# Patient Record
Sex: Female | Born: 1970 | Hispanic: Yes | Marital: Married | Smoking: Never smoker
Health system: Southern US, Community
[De-identification: ages and names within clinical notes are randomized; demographics above are authoritative.]

---

## 2016-10-15 ENCOUNTER — Other Ambulatory Visit: Payer: Self-pay | Admitting: Family Medicine

## 2016-10-15 DIAGNOSIS — N938 Other specified abnormal uterine and vaginal bleeding: Secondary | ICD-10-CM

## 2016-10-19 ENCOUNTER — Ambulatory Visit
Admission: RE | Admit: 2016-10-19 | Discharge: 2016-10-19 | Disposition: A | Discharge status: Home / Self Care | Payer: BLUE CROSS/BLUE SHIELD | Source: Ambulatory Visit | Attending: Family Medicine | Admitting: Family Medicine

## 2016-10-19 DIAGNOSIS — N83202 Unspecified ovarian cyst, left side: Secondary | ICD-10-CM | POA: Diagnosis not present

## 2016-10-19 DIAGNOSIS — N938 Other specified abnormal uterine and vaginal bleeding: Secondary | ICD-10-CM

## 2017-07-08 DIAGNOSIS — R109 Unspecified abdominal pain: Secondary | ICD-10-CM | POA: Insufficient documentation

## 2017-07-08 DIAGNOSIS — Y92410 Unspecified street and highway as the place of occurrence of the external cause: Secondary | ICD-10-CM | POA: Insufficient documentation

## 2017-07-08 DIAGNOSIS — Y9389 Activity, other specified: Secondary | ICD-10-CM | POA: Insufficient documentation

## 2017-07-08 DIAGNOSIS — T07XXXA Unspecified multiple injuries, initial encounter: Secondary | ICD-10-CM | POA: Insufficient documentation

## 2017-07-08 DIAGNOSIS — Y999 Unspecified external cause status: Secondary | ICD-10-CM | POA: Insufficient documentation

## 2017-07-09 ENCOUNTER — Emergency Department: Payer: Self-pay

## 2017-07-09 ENCOUNTER — Encounter: Payer: Self-pay | Admitting: Emergency Medicine

## 2017-07-09 ENCOUNTER — Other Ambulatory Visit: Payer: Self-pay

## 2017-07-09 ENCOUNTER — Emergency Department
Admission: EM | Admit: 2017-07-09 | Discharge: 2017-07-09 | Disposition: A | Discharge status: Home / Self Care | Payer: Self-pay | Attending: Emergency Medicine | Admitting: Emergency Medicine

## 2017-07-09 DIAGNOSIS — T07XXXA Unspecified multiple injuries, initial encounter: Secondary | ICD-10-CM

## 2017-07-09 MED ORDER — ONDANSETRON HCL 4 MG/2ML IJ SOLN
INTRAMUSCULAR | Status: AC
  Administered 2017-07-09: 4 mg via INTRAVENOUS
  Filled 2017-07-09: qty 2

## 2017-07-09 MED ORDER — IOPAMIDOL (ISOVUE-300) INJECTION 61%
100.0000 mL | Freq: Once | INTRAVENOUS | Status: AC | PRN
  Administered 2017-07-09: 100 mL via INTRAVENOUS

## 2017-07-09 MED ORDER — OXYCODONE-ACETAMINOPHEN 5-325 MG PO TABS
1.0000 | ORAL_TABLET | ORAL | Status: DC | PRN
Start: 2017-07-09 — End: 2017-07-09
  Administered 2017-07-09: 1 via ORAL

## 2017-07-09 MED ORDER — ONDANSETRON 4 MG PO TBDP
ORAL_TABLET | ORAL | Status: AC
  Filled 2017-07-09: qty 1

## 2017-07-09 MED ORDER — MORPHINE SULFATE (PF) 2 MG/ML IV SOLN
2.0000 mg | Freq: Once | INTRAVENOUS | Status: AC
  Administered 2017-07-09: 2 mg via INTRAVENOUS

## 2017-07-09 MED ORDER — TRAMADOL HCL 50 MG PO TABS: 50.0000 mg | ORAL_TABLET | Freq: Four times a day (QID) | ORAL | 0 refills | Status: AC | PRN

## 2017-07-09 MED ORDER — ONDANSETRON HCL 4 MG/2ML IJ SOLN
4.0000 mg | Freq: Once | INTRAMUSCULAR | Status: AC
  Administered 2017-07-09: 4 mg via INTRAVENOUS

## 2017-07-09 MED ORDER — OXYCODONE-ACETAMINOPHEN 5-325 MG PO TABS
ORAL_TABLET | ORAL | Status: AC
  Filled 2017-07-09: qty 1

## 2017-07-09 MED ORDER — MORPHINE SULFATE (PF) 2 MG/ML IV SOLN
INTRAVENOUS | Status: AC
  Administered 2017-07-09: 2 mg via INTRAVENOUS
  Filled 2017-07-09: qty 1

## 2017-07-09 MED ORDER — ONDANSETRON 4 MG PO TBDP
4.0000 mg | ORAL_TABLET | Freq: Once | ORAL | Status: AC
  Administered 2017-07-09: 4 mg via ORAL

## 2017-07-09 NOTE — ED Notes (Signed)
Report off to kailey rn  
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
## 2021-03-23 ENCOUNTER — Other Ambulatory Visit: Payer: Self-pay | Admitting: Family Medicine

## 2021-03-23 DIAGNOSIS — Z1231 Encounter for screening mammogram for malignant neoplasm of breast: Secondary | ICD-10-CM

## 2022-02-12 ENCOUNTER — Other Ambulatory Visit: Payer: Self-pay

## 2022-02-12 ENCOUNTER — Encounter: Payer: Self-pay | Admitting: Emergency Medicine

## 2022-02-12 ENCOUNTER — Ambulatory Visit
Admission: EM | Admit: 2022-02-12 | Discharge: 2022-02-12 | Disposition: A | Discharge status: Home / Self Care | Payer: Commercial Managed Care - PPO | Attending: Student | Admitting: Student

## 2022-02-12 DIAGNOSIS — N3000 Acute cystitis without hematuria: Secondary | ICD-10-CM | POA: Insufficient documentation

## 2022-02-12 LAB — URINALYSIS, ROUTINE W REFLEX MICROSCOPIC
Bilirubin Urine: NEGATIVE
Glucose, UA: 100 mg/dL — AB
Ketones, ur: NEGATIVE mg/dL
Nitrite: POSITIVE — AB
Protein, ur: 100 mg/dL — AB
Specific Gravity, Urine: <1.005 — ABNORMAL LOW (ref 1.005–1.030)
pH: 5 (ref 5.0–8.0)

## 2022-02-12 LAB — URINALYSIS, MICROSCOPIC (REFLEX)

## 2022-02-12 MED ORDER — CEPHALEXIN 500 MG PO CAPS: 500.0000 mg | ORAL_CAPSULE | Freq: Two times a day (BID) | ORAL | 0 refills | Status: AC

## 2022-02-12 NOTE — ED Triage Notes (Signed)
Complains of lower back pain and minimal lower abdominal pain.  Pain with urination.  Patient has been taking azo.  Patient started having symptoms on Saturday.  Started azo on Saturday.

## 2022-02-12 NOTE — ED Provider Notes (Signed)
[facility]    CSN: [id number] Arrival date & time: 02/12/22  1051      History   Chief Complaint Chief Complaint  Patient presents with   Back Pain    HPI Ellen Wyatt is a 51 y.o. female presenting with urinary symptoms for 4 days.  History of UTI in the past, but she states this was greater than 4 years ago.  She describes bilateral flank pain, and suprapubic pressure.  Also with dysuria, frequency.  She has been taking Azo with some relief.  Denies gross hematuria, incontinence, vaginal symptoms.  She states she does not drink enough water.  HPI  History reviewed. No pertinent past medical history.  There are no problems to display for this patient.   History reviewed. No pertinent surgical history.  OB History   No obstetric history on file.      Home Medications    Prior to Admission medications   Medication Sig Start Date End Date Taking? Authorizing Provider  cephALEXin (KEFLEX) 500 MG capsule Take 1 capsule (500 mg total) by mouth 2 (two) times daily for 7 days. 02/12/22 02/19/22 Yes Rhys Martini, PA-C    Family History Family History  Problem Relation Age of Onset   Healthy Mother    Healthy Father     Social History Social History   Tobacco Use   Smoking status: Never   Smokeless tobacco: Never  Vaping Use   Vaping Use: Never used  Substance Use Topics   Alcohol use: No   Drug use: No     Allergies   Patient has no known allergies.   Review of Systems Review of Systems  Constitutional:  Negative for appetite change, chills, diaphoresis and fever.  Respiratory:  Negative for shortness of breath.   Cardiovascular:  Negative for chest pain.  Gastrointestinal:  Positive for abdominal pain. Negative for blood in stool, constipation, diarrhea, nausea and vomiting.  Genitourinary:  Positive for dysuria, flank pain and frequency. Negative for decreased urine volume, difficulty urinating, genital sores, hematuria and urgency.   Musculoskeletal:  Negative for back pain.  Neurological:  Negative for dizziness, weakness and light-headedness.  All other systems reviewed and are negative.    Physical Exam Triage Vital Signs ED Triage Vitals  Enc Vitals Group     BP 02/12/22 1112 132/83     Pulse Rate 02/12/22 1112 85     Resp 02/12/22 1112 20     Temp 02/12/22 1112 98.6 F (37 C)     Temp Source 02/12/22 1112 Oral     SpO2 02/12/22 1112 98 %     Weight --      Height --      Head Circumference --      Peak Flow --      Pain Score 02/12/22 1109 5     Pain Loc --      Pain Edu? --      Excl. in GC? --    No data found.  Updated Vital Signs BP 132/83 (BP Location: Right Arm)   Pulse 85   Temp 98.6 F (37 C) (Oral)   Resp 20   SpO2 98%   Visual Acuity Right Eye Distance:   Left Eye Distance:   Bilateral Distance:    Right Eye Near:   Left Eye Near:    Bilateral Near:     Physical Exam Vitals reviewed.  Constitutional:      General: She is not in acute distress.  Appearance: Normal appearance. She is not ill-appearing.  HENT:     Head: Normocephalic and atraumatic.     Mouth/Throat:     Mouth: Mucous membranes are moist.     Comments: Moist mucous membranes Eyes:     Extraocular Movements: Extraocular movements intact.     Pupils: Pupils are equal, round, and reactive to light.  Cardiovascular:     Rate and Rhythm: Normal rate and regular rhythm.     Heart sounds: Normal heart sounds.  Pulmonary:     Effort: Pulmonary effort is normal.     Breath sounds: Normal breath sounds. No wheezing, rhonchi or rales.  Abdominal:     General: Bowel sounds are normal. There is no distension.     Palpations: Abdomen is soft. There is no mass.     Tenderness: There is abdominal tenderness in the suprapubic area. There is no right CVA tenderness, left CVA tenderness, guarding or rebound. Negative signs include Murphy's sign, Rovsing's sign and McBurney's sign.     Comments: No reproducible  flank pain   Skin:    General: Skin is warm.     Capillary Refill: Capillary refill takes less than 2 seconds.     Comments: Good skin turgor  Neurological:     General: No focal deficit present.     Mental Status: She is alert and oriented to person, place, and time.  Psychiatric:        Mood and Affect: Mood normal.        Behavior: Behavior normal.      UC Treatments / Results  Labs (all labs ordered are listed, but only abnormal results are displayed) Labs Reviewed  URINALYSIS, ROUTINE W REFLEX MICROSCOPIC - Abnormal; Notable for the following components:      Result Value   Color, Urine ORANGE (*)    APPearance HAZY (*)    Specific Gravity, Urine <1.005 (*)    Glucose, UA 100 (*)    Hgb urine dipstick MODERATE (*)    Protein, ur 100 (*)    Nitrite POSITIVE (*)    Leukocytes,Ua SMALL (*)    All other components within normal limits  URINALYSIS, MICROSCOPIC (REFLEX) - Abnormal; Notable for the following components:   Bacteria, UA MANY (*)    All other components within normal limits  URINE CULTURE    EKG   Radiology No results found.  Procedures Procedures (including critical care time)  Medications Ordered in UC Medications - No data to display  Initial Impression / Assessment and Plan / UC Course  I have reviewed the triage vital signs and the nursing notes.  Pertinent labs & imaging results that were available during my care of the patient were reviewed by me and considered in my medical decision making (see chart for details).     This patient is a very pleasant 50 y.o. year old female presenting with acute cystitis. Afebrile, nontachy.  UA diffusely positive given Azo use. Culture sent. Will manage with keflex, good hydration.   ED return precautions discussed. Patient verbalizes understanding and agreement.    Final Clinical Impressions(s) / UC Diagnoses   Final diagnoses:  Acute cystitis without hematuria     Discharge Instructions       -Keflex twice daily x7 days -Azo for discomfort -Good hydration -Follow-up if new symptoms like worsening back pain or new fevers   ED Prescriptions     Medication Sig Dispense Auth. Provider   cephALEXin (KEFLEX) 500 MG capsule Take 1  capsule (500 mg total) by mouth 2 (two) times daily for 7 days. 14 capsule Rhys MartiniGraham, Ibrohim Simmers E, PA-C      PDMP not reviewed this encounter.   Rhys MartiniGraham, Nesreen Albano E, PA-C 02/12/22 1217

## 2022-02-12 NOTE — Discharge Instructions (Addendum)
-  Keflex twice daily x7 days -Azo for discomfort -Good hydration -Follow-up if new symptoms like worsening back pain or new fevers

## 2022-02-13 LAB — URINE CULTURE: Culture: >=100000 — AB

## 2022-02-14 LAB — URINE CULTURE

## 2022-02-16 ENCOUNTER — Other Ambulatory Visit: Payer: Self-pay | Admitting: Family Medicine

## 2022-02-16 DIAGNOSIS — Z1231 Encounter for screening mammogram for malignant neoplasm of breast: Secondary | ICD-10-CM

## 2022-08-31 ENCOUNTER — Ambulatory Visit
Admission: EM | Admit: 2022-08-31 | Discharge: 2022-08-31 | Disposition: A | Discharge status: Home / Self Care | Payer: Commercial Managed Care - PPO | Attending: Emergency Medicine | Admitting: Emergency Medicine

## 2022-08-31 DIAGNOSIS — J069 Acute upper respiratory infection, unspecified: Secondary | ICD-10-CM

## 2022-08-31 DIAGNOSIS — R112 Nausea with vomiting, unspecified: Secondary | ICD-10-CM | POA: Diagnosis not present

## 2022-08-31 DIAGNOSIS — R197 Diarrhea, unspecified: Secondary | ICD-10-CM

## 2022-08-31 MED ORDER — PROMETHAZINE-DM 6.25-15 MG/5ML PO SYRP: 5.0000 mL | ORAL_SOLUTION | Freq: Four times a day (QID) | ORAL | 0 refills | Status: AC | PRN

## 2022-08-31 MED ORDER — ONDANSETRON 8 MG PO TBDP: 8.0000 mg | ORAL_TABLET | Freq: Three times a day (TID) | ORAL | 0 refills | Status: AC | PRN

## 2022-08-31 MED ORDER — BENZONATATE 100 MG PO CAPS: 200.0000 mg | ORAL_CAPSULE | Freq: Three times a day (TID) | ORAL | 0 refills | Status: DC

## 2022-08-31 NOTE — ED Provider Notes (Signed)
[facility]    CSN: [id number] Arrival date & time: 08/31/22  1224      History   Chief Complaint Chief Complaint  Patient presents with   Emesis    Vomiting, loss of appetite, and cough. X2 days    HPI Ellen Wyatt is a 51 y.o. female.   HPI  51 year old female here for evaluation of cough and vomiting.  The patient reports that her symptoms began yesterday and they consist of a cough that is intermittently productive, several episodes of vomiting, diarrhea, and a decreased appetite.  She denies any fever, runny nose, nasal congestion, or sore throat.  She also denies abdominal pain.  She states that one of her coworkers was recently absent from work for similar symptoms.  History reviewed. No pertinent past medical history.  There are no problems to display for this patient.   History reviewed. No pertinent surgical history.  OB History   No obstetric history on file.      Home Medications    Prior to Admission medications   Medication Sig Start Date End Date Taking? Authorizing Provider  benzonatate (TESSALON) 100 MG capsule Take 2 capsules (200 mg total) by mouth every 8 (eight) hours. 08/31/22  Yes Becky Augusta, NP  ondansetron (ZOFRAN-ODT) 8 MG disintegrating tablet Take 1 tablet (8 mg total) by mouth every 8 (eight) hours as needed for nausea or vomiting. 08/31/22  Yes Becky Augusta, NP  promethazine-dextromethorphan (PROMETHAZINE-DM) 6.25-15 MG/5ML syrup Take 5 mLs by mouth 4 (four) times daily as needed. 08/31/22  Yes Becky Augusta, NP    Family History Family History  Problem Relation Age of Onset   Healthy Mother    Healthy Father     Social History Social History   Tobacco Use   Smoking status: Never   Smokeless tobacco: Never  Vaping Use   Vaping Use: Never used  Substance Use Topics   Alcohol use: No   Drug use: No     Allergies   Patient has no known allergies.   Review of Systems Review of Systems  Constitutional:   Negative for fever.  HENT:  Negative for congestion, ear pain, rhinorrhea and sore throat.   Respiratory:  Positive for cough. Negative for shortness of breath and wheezing.   Gastrointestinal:  Positive for diarrhea, nausea and vomiting. Negative for abdominal pain.  Skin:  Negative for rash.  Hematological: Negative.   Psychiatric/Behavioral: Negative.       Physical Exam Triage Vital Signs ED Triage Vitals  Enc Vitals Group     BP 08/31/22 1339 124/78     Pulse Rate 08/31/22 1339 78     Resp 08/31/22 1339 18     Temp 08/31/22 1339 98.8 F (37.1 C)     Temp Source 08/31/22 1339 Oral     SpO2 08/31/22 1339 98 %     Weight 08/31/22 1337 165 lb (74.8 kg)     Height 08/31/22 1337 5\' 1"  (1.549 m)     Head Circumference --      Peak Flow --      Pain Score 08/31/22 1337 0     Pain Loc --      Pain Edu? --      Excl. in GC? --    No data found.  Updated Vital Signs BP 124/78 (BP Location: Left Arm)   Pulse 78   Temp 98.8 F (37.1 C) (Oral)   Resp 18   Ht 5\' 1"  (1.549 m)  Wt 165 lb (74.8 kg)   SpO2 98%   BMI 31.18 kg/m   Visual Acuity Right Eye Distance:   Left Eye Distance:   Bilateral Distance:    Right Eye Near:   Left Eye Near:    Bilateral Near:     Physical Exam Vitals and nursing note reviewed.  Constitutional:      Appearance: Normal appearance.  HENT:     Head: Normocephalic and atraumatic.     Right Ear: Tympanic membrane, ear canal and external ear normal. There is no impacted cerumen.     Left Ear: Tympanic membrane, ear canal and external ear normal. There is no impacted cerumen.     Nose: Nose normal. No congestion or rhinorrhea.     Mouth/Throat:     Mouth: Mucous membranes are moist.     Pharynx: Oropharynx is clear. No oropharyngeal exudate or posterior oropharyngeal erythema.  Cardiovascular:     Rate and Rhythm: Normal rate and regular rhythm.     Pulses: Normal pulses.     Heart sounds: Normal heart sounds. No murmur heard.    No  friction rub. No gallop.  Pulmonary:     Effort: Pulmonary effort is normal.     Breath sounds: Normal breath sounds. No wheezing, rhonchi or rales.  Abdominal:     General: Abdomen is flat. Bowel sounds are normal.     Palpations: Abdomen is soft.     Tenderness: There is no abdominal tenderness. There is no guarding or rebound.  Musculoskeletal:     Cervical back: Normal range of motion and neck supple.  Lymphadenopathy:     Cervical: No cervical adenopathy.  Skin:    General: Skin is warm and dry.     Capillary Refill: Capillary refill takes less than 2 seconds.     Findings: No erythema or rash.  Neurological:     General: No focal deficit present.     Mental Status: She is alert and oriented to person, place, and time.  Psychiatric:        Mood and Affect: Mood normal.        Behavior: Behavior normal.        Thought Content: Thought content normal.        Judgment: Judgment normal.      UC Treatments / Results  Labs (all labs ordered are listed, but only abnormal results are displayed) Labs Reviewed - No data to display  EKG   Radiology No results found.  Procedures Procedures (including critical care time)  Medications Ordered in UC Medications - No data to display  Initial Impression / Assessment and Plan / UC Course  I have reviewed the triage vital signs and the nursing notes.  Pertinent labs & imaging results that were available during my care of the patient were reviewed by me and considered in my medical decision making (see chart for details).   Patient is very pleasant, nontoxic-appearing 51 year old female here for evaluation of productive cough, vomiting, diarrhea, decreased appetite that may go on for the past 2 days outlined HPI above.  She denies any upper respiratory symptoms and she states that one of her coworkers recently was out for similar symptoms.  She states that her vomiting is not all the time but she has vomited in response to drinking  both water and eating small amounts of food.  She also endorses a mild amount of diarrhea with symptom onset being yesterday.  Her exam does not reveal any inflammation  of her upper respiratory tree and her lungs are" fields.  Her abdomen is soft, flat, and nontender.  I suspect that the patient is a viral illness.  I will discharge her home with a prescription for Zofran to help her with her nausea and have encouraged her to follow a clear liquid diet for the next 6 to 12 hours.  I will also prescribe Tessalon Perles and Promethazine DM cough syrup she can use for her cough.   Final Clinical Impressions(s) / UC Diagnoses   Final diagnoses:  Nausea vomiting and diarrhea  Viral URI with cough     Discharge Instructions      Take the Zofran every 8 hours as needed for nausea and vomiting.  They are an oral disintegrating tablet and you can place them on her under your tongue and then will be absorbed.  Follow a clear liquid diet for the next 6 to 12 hours.  Clear liquids consist of broth, ginger ale, water, Pedialyte, and Jell-O.  After 6 to 12 hours, if you are tolerating clear liquids, you can advance to bland foods such as bananas, rice, applesauce, and toast.  If you tolerate bland foods you can continue to advance your diet as you see fit.  Use the Tessalon Perles every 8 hours during the day.  Take them with a small sip of water.  They may give you some numbness to the base of your tongue or a metallic taste in your mouth, this is normal.  Use the Promethazine DM cough syrup at bedtime for cough and congestion.  It will make you drowsy so do not take it during the day.  If you develop a fever over 100.5, increased abdominal pain, bloody vomit, or bloody stool return for reevaluation or go to the ER.      ED Prescriptions     Medication Sig Dispense Auth. Provider   ondansetron (ZOFRAN-ODT) 8 MG disintegrating tablet Take 1 tablet (8 mg total) by mouth every 8 (eight) hours as  needed for nausea or vomiting. 20 tablet Becky Augusta, NP   benzonatate (TESSALON) 100 MG capsule Take 2 capsules (200 mg total) by mouth every 8 (eight) hours. 21 capsule Becky Augusta, NP   promethazine-dextromethorphan (PROMETHAZINE-DM) 6.25-15 MG/5ML syrup Take 5 mLs by mouth 4 (four) times daily as needed. 118 mL Becky Augusta, NP      PDMP not reviewed this encounter.   Becky Augusta, NP 08/31/22 1425

## 2022-08-31 NOTE — Discharge Instructions (Signed)
Take the Zofran every 8 hours as needed for nausea and vomiting.  They are an oral disintegrating tablet and you can place them on her under your tongue and then will be absorbed.  Follow a clear liquid diet for the next 6 to 12 hours.  Clear liquids consist of broth, ginger ale, water, Pedialyte, and Jell-O.  After 6 to 12 hours, if you are tolerating clear liquids, you can advance to bland foods such as bananas, rice, applesauce, and toast.  If you tolerate bland foods you can continue to advance your diet as you see fit.  Use the Tessalon Perles every 8 hours during the day.  Take them with a small sip of water.  They may give you some numbness to the base of your tongue or a metallic taste in your mouth, this is normal.  Use the Promethazine DM cough syrup at bedtime for cough and congestion.  It will make you drowsy so do not take it during the day.  If you develop a fever over 100.5, increased abdominal pain, bloody vomit, or bloody stool return for reevaluation or go to the ER.

## 2022-08-31 NOTE — ED Triage Notes (Signed)
Pt states that she has some vomiting, coughing, and loss of appetite. X2 days

## 2022-09-06 ENCOUNTER — Ambulatory Visit
Admission: EM | Admit: 2022-09-06 | Discharge: 2022-09-06 | Disposition: A | Discharge status: Home / Self Care | Payer: Commercial Managed Care - PPO | Attending: Emergency Medicine | Admitting: Emergency Medicine

## 2022-09-06 ENCOUNTER — Encounter: Payer: Self-pay | Admitting: Emergency Medicine

## 2022-09-06 ENCOUNTER — Ambulatory Visit (INDEPENDENT_AMBULATORY_CARE_PROVIDER_SITE_OTHER): Payer: Commercial Managed Care - PPO

## 2022-09-06 DIAGNOSIS — J029 Acute pharyngitis, unspecified: Secondary | ICD-10-CM | POA: Diagnosis not present

## 2022-09-06 DIAGNOSIS — R059 Cough, unspecified: Secondary | ICD-10-CM | POA: Diagnosis not present

## 2022-09-06 DIAGNOSIS — R051 Acute cough: Secondary | ICD-10-CM | POA: Diagnosis present

## 2022-09-06 DIAGNOSIS — R0989 Other specified symptoms and signs involving the circulatory and respiratory systems: Secondary | ICD-10-CM

## 2022-09-06 LAB — GROUP A STREP BY PCR: Group A Strep by PCR: NOT DETECTED

## 2022-09-06 MED ORDER — FAMOTIDINE 20 MG PO TABS: 20.0000 mg | ORAL_TABLET | Freq: Two times a day (BID) | ORAL | 0 refills | Status: AC

## 2022-09-06 MED ORDER — BENZONATATE 100 MG PO CAPS: 200.0000 mg | ORAL_CAPSULE | Freq: Three times a day (TID) | ORAL | 0 refills | Status: AC

## 2022-09-06 NOTE — Discharge Instructions (Addendum)
Your chest x-ray was negative for pneumonia, and your strep test is negative I suspect that your sore throat is from the coughing. Make sure you drink plenty of extra fluids.  Some people find salt water gargles and  Traditional Medic[employer]'s "Throat Coat" tea helpful. Take 5 mL of liquid Benadryl and 5 mL of Maalox. Mix it together, and then hold it in your mouth for as long as you can and then swallow. You may do this 4 times a day.  Honey and lemon dissolved in hot water can also be soothing.  Go to www.goodrx.com  or www.costplusdrugs.com to look up your medications. This will give you a list of where you can find your prescriptions at the most affordable prices. Or ask the pharmacist what the cash price is, or if they have any other discount programs available to help make your medication more affordable. This can be less expensive than what you would pay with insurance.

## 2022-09-06 NOTE — ED Provider Notes (Signed)
HPI  SUBJECTIVE:  Ellen Wyatt is a 52 y.o. female who presents with sore throat located at the base of her neck, nasal congestion, rhinorrhea, chest congestion, dry cough, wheezing, shortness of breath for the past 2 days.  She states that she has felt feverish, but has not checked her temperature at home.  No body aches, sinus pain or pressure, postnasal drip, voice changes, sensation of throat swelling shut, neck stiffness, drooling, trismus.  She points to her right upper chest at the clavicle/base of the neck as the location of her chest pain.  She describes his pain as sore, constant, present for the past 2 days.  There is no positional or exertional component.  It does not go up her neck, down her arm, through to the back, no nausea, diaphoresis, belching, burning chest pain, waterbrash.  She has had chest pain like this before when she was sick with influenza.  She tried Advil with improvement in her symptoms.  Symptoms are worse with coughing.  She has been taking Promethazine DM and Tessalon that was prescribed to her on her previous visit with improvement in her symptoms.  No aggravating factors.  Past medical history negative for GERD, diabetes, hypertension, pulmonary disease, smoking.  PCP: [facility]  Patient was seen here on 12/29 for acute gastroenteritis type symptoms.  She was thought to have a viral illness.  No additional testing was done on that day.  She was sent home with Zofran, Promethazine DM, Tessalon Perles.  History reviewed. No pertinent past medical history.  History reviewed. No pertinent surgical history.  Family History  Problem Relation Age of Onset   Healthy Mother    Healthy Father     Social History   Tobacco Use   Smoking status: Never   Smokeless tobacco: Never  Vaping Use   Vaping Use: Never used  Substance Use Topics   Alcohol use: No   Drug use: No    No current facility-administered medications for this encounter.  Current  Outpatient Medications:    famotidine (PEPCID) 20 MG tablet, Take 1 tablet (20 mg total) by mouth 2 (two) times daily., Disp: 40 tablet, Rfl: 0   benzonatate (TESSALON) 100 MG capsule, Take 2 capsules (200 mg total) by mouth every 8 (eight) hours., Disp: 21 capsule, Rfl: 0   ondansetron (ZOFRAN-ODT) 8 MG disintegrating tablet, Take 1 tablet (8 mg total) by mouth every 8 (eight) hours as needed for nausea or vomiting., Disp: 20 tablet, Rfl: 0   promethazine-dextromethorphan (PROMETHAZINE-DM) 6.25-15 MG/5ML syrup, Take 5 mLs by mouth 4 (four) times daily as needed., Disp: 118 mL, Rfl: 0  No Known Allergies   ROS  As noted in HPI.   Physical Exam  BP 118/73 (BP Location: Left Arm)   Temp 98.3 F (36.8 C)   SpO2 100%   Constitutional: Well developed, well nourished, no acute distress Eyes:  EOMI, conjunctiva normal bilaterally HENT: Normocephalic, atraumatic,mucus membranes moist.  Nasal congestion.  No sinus tenderness.  Slightly erythematous oropharynx.  Tonsils normal size without exudates.   Neck: Positive right-sided tender cervical lymphadenopathy.  No subcutaneous crepitus. Respiratory: Normal inspiratory effort, lungs clear bilaterally.  No chest wall tenderness. Cardiovascular: Normal rate, regular rhythm, no murmurs rubs or gallops GI: nondistended skin: No rash, skin intact Musculoskeletal: no deformities Neurologic: Alert & oriented x 3, no focal neuro deficits Psychiatric: Speech and behavior appropriate   ED Course   Medications - No data to display  Orders Placed This Encounter  Procedures   Group A Strep by PCR    Standing Status:   Standing    Number of Occurrences:   1   DG Chest 2 View    Standing Status:   Standing    Number of Occurrences:   1    Order Specific Question:   Reason for Exam (SYMPTOM  OR DIAGNOSIS REQUIRED)    Answer:   cough chest congestion r/o PNA    Results for orders placed or performed during the hospital encounter of 09/06/22  (from the past 24 hour(s))  Group A Strep by PCR     Status: None   Collection Time: 09/06/22  3:29 PM   Specimen: Throat; Sterile Swab  Result Value Ref Range   Group A Strep by PCR NOT DETECTED NOT DETECTED   DG Chest 2 View  Result Date: 09/06/2022 CLINICAL DATA:  Cough chest congestion EXAM: CHEST - 2 VIEW COMPARISON:  None Available. FINDINGS: The heart size and mediastinal contours are within normal limits. Both lungs are clear. The visualized skeletal structures are unremarkable. IMPRESSION: No active cardiopulmonary disease. Electronically Signed   By: Donavan Foil M.D.   On: 09/06/2022 16:30    ED Clinical Impression  1. Sore throat   2. Acute cough      ED Assessment/Plan     Previous records reviewed.  As noted in HPI.  On further history, it appears that the primary complaint is sore throat and pain at the base of the right neck.  She is not pointing to her chest as the area of pain.  However, checking chest x-ray as she is reporting cough, chest congestion to rule out secondary pneumonia.  Reviewed imaging independently. Normal CXR. See radiology report for full details.  Strep PCR is negative, there is no evidence of impending airway compromise.  She has normal voice, no drooling, trismus, neck stiffness no subcutaneous crepitus.  She does have tender cervical lymphadenopathy suggestive of a viral process.  Suspect that sore throat could be reflux from the coughing that she has been doing.  She has no subcutaneous crepitus concerning for esophageal rupture.  Will send home with Benadryl/Maalox mixture, refill Tessalon.  She has plenty of Promethazine DM.  She will follow-up with her PCP if not better in several days.  ER return precautions given.  Discussed labs, imaging, MDM, treatment plan, and plan for follow-up with patient. Discussed sn/sx that should prompt return to the ED. patient agrees with plan.   Meds ordered this encounter  Medications   benzonatate  (TESSALON) 100 MG capsule    Sig: Take 2 capsules (200 mg total) by mouth every 8 (eight) hours.    Dispense:  21 capsule    Refill:  0   famotidine (PEPCID) 20 MG tablet    Sig: Take 1 tablet (20 mg total) by mouth 2 (two) times daily.    Dispense:  40 tablet    Refill:  0      *This clinic note was created using [employer]. Therefore, there may be occasional mistakes despite careful proofreading.  ?    Melynda Ripple, MD 09/08/22 1724

## 2022-09-06 NOTE — ED Triage Notes (Signed)
Pt presents with cough, sore throat and chest pain x 2 days.

## 2023-08-06 ENCOUNTER — Other Ambulatory Visit: Payer: Self-pay | Admitting: Family Medicine

## 2023-08-06 DIAGNOSIS — Z1231 Encounter for screening mammogram for malignant neoplasm of breast: Secondary | ICD-10-CM

## 2024-05-11 ENCOUNTER — Inpatient Hospital Stay: Admission: RE | Admit: 2024-05-11 | Source: Ambulatory Visit
